# Patient Record
Sex: Male | Born: 1980 | Race: Black or African American | Hispanic: No | Smoking: Former smoker
Health system: Southern US, Community
[De-identification: ages and names within clinical notes are randomized; demographics above are authoritative.]

## PROBLEM LIST (undated history)

## (undated) DIAGNOSIS — I10 Essential (primary) hypertension: Secondary | ICD-10-CM

## (undated) DIAGNOSIS — K589 Irritable bowel syndrome without diarrhea: Secondary | ICD-10-CM

## (undated) DIAGNOSIS — F1010 Alcohol abuse, uncomplicated: Secondary | ICD-10-CM

## (undated) DIAGNOSIS — I639 Cerebral infarction, unspecified: Secondary | ICD-10-CM

## (undated) HISTORY — PX: APPENDECTOMY: SHX54

## (undated) HISTORY — PX: KNEE SURGERY: SHX244

## (undated) HISTORY — PX: TONSILLECTOMY: SUR1361

---

## 2005-03-20 ENCOUNTER — Emergency Department (HOSPITAL_COMMUNITY): Admission: EM | Admit: 2005-03-20 | Discharge: 2005-03-20 | Payer: Self-pay | Admitting: Emergency Medicine

## 2010-01-27 ENCOUNTER — Ambulatory Visit: Payer: Self-pay | Admitting: Diagnostic Radiology

## 2010-01-27 ENCOUNTER — Emergency Department (HOSPITAL_BASED_OUTPATIENT_CLINIC_OR_DEPARTMENT_OTHER): Admission: EM | Admit: 2010-01-27 | Discharge: 2010-01-28 | Payer: Self-pay | Admitting: Emergency Medicine

## 2010-08-23 ENCOUNTER — Emergency Department (INDEPENDENT_AMBULATORY_CARE_PROVIDER_SITE_OTHER)
Admission: EM | Admit: 2010-08-23 | Discharge: 2010-08-23 | Payer: Self-pay | Source: Home / Self Care | Admitting: Emergency Medicine

## 2010-08-23 DIAGNOSIS — M542 Cervicalgia: Secondary | ICD-10-CM

## 2010-08-23 DIAGNOSIS — R079 Chest pain, unspecified: Secondary | ICD-10-CM

## 2010-11-02 ENCOUNTER — Emergency Department (HOSPITAL_BASED_OUTPATIENT_CLINIC_OR_DEPARTMENT_OTHER)
Admission: EM | Admit: 2010-11-02 | Discharge: 2010-11-02 | Disposition: A | Discharge status: Home / Self Care | Payer: Self-pay | Attending: Emergency Medicine | Admitting: Emergency Medicine

## 2010-11-02 DIAGNOSIS — E860 Dehydration: Secondary | ICD-10-CM | POA: Insufficient documentation

## 2010-11-02 DIAGNOSIS — R197 Diarrhea, unspecified: Secondary | ICD-10-CM | POA: Insufficient documentation

## 2010-11-02 DIAGNOSIS — F17200 Nicotine dependence, unspecified, uncomplicated: Secondary | ICD-10-CM | POA: Insufficient documentation

## 2010-11-02 DIAGNOSIS — R51 Headache: Secondary | ICD-10-CM | POA: Insufficient documentation

## 2010-11-02 DIAGNOSIS — R112 Nausea with vomiting, unspecified: Secondary | ICD-10-CM | POA: Insufficient documentation

## 2010-11-02 LAB — BASIC METABOLIC PANEL
BUN: 9 mg/dL (ref 6–23)
CO2: 24 mEq/L (ref 19–32)
Calcium: 9.9 mg/dL (ref 8.4–10.5)
Creatinine, Ser: 0.9 mg/dL (ref 0.4–1.5)
GFR calc Af Amer: >60 mL/min (ref >60)
GFR calc non Af Amer: >60 mL/min (ref >60)
Glucose, Bld: 107 mg/dL — ABNORMAL HIGH (ref 70–99)
Sodium: 145 mEq/L (ref 135–145)

## 2011-09-29 ENCOUNTER — Emergency Department (INDEPENDENT_AMBULATORY_CARE_PROVIDER_SITE_OTHER): Payer: No Typology Code available for payment source

## 2011-09-29 ENCOUNTER — Encounter (HOSPITAL_BASED_OUTPATIENT_CLINIC_OR_DEPARTMENT_OTHER): Payer: Self-pay | Admitting: *Deleted

## 2011-09-29 ENCOUNTER — Emergency Department (HOSPITAL_BASED_OUTPATIENT_CLINIC_OR_DEPARTMENT_OTHER)
Admission: EM | Admit: 2011-09-29 | Discharge: 2011-09-30 | Disposition: A | Discharge status: Home Health | Payer: No Typology Code available for payment source | Attending: Emergency Medicine | Admitting: Emergency Medicine

## 2011-09-29 DIAGNOSIS — R079 Chest pain, unspecified: Secondary | ICD-10-CM

## 2011-09-29 DIAGNOSIS — Z79899 Other long term (current) drug therapy: Secondary | ICD-10-CM | POA: Insufficient documentation

## 2011-09-29 DIAGNOSIS — M549 Dorsalgia, unspecified: Secondary | ICD-10-CM

## 2011-09-29 DIAGNOSIS — M542 Cervicalgia: Secondary | ICD-10-CM

## 2011-09-29 DIAGNOSIS — K589 Irritable bowel syndrome without diarrhea: Secondary | ICD-10-CM | POA: Insufficient documentation

## 2011-09-29 DIAGNOSIS — S139XXA Sprain of joints and ligaments of unspecified parts of neck, initial encounter: Secondary | ICD-10-CM | POA: Insufficient documentation

## 2011-09-29 DIAGNOSIS — F17200 Nicotine dependence, unspecified, uncomplicated: Secondary | ICD-10-CM

## 2011-09-29 DIAGNOSIS — R51 Headache: Secondary | ICD-10-CM

## 2011-09-29 DIAGNOSIS — S161XXA Strain of muscle, fascia and tendon at neck level, initial encounter: Secondary | ICD-10-CM

## 2011-09-29 DIAGNOSIS — R42 Dizziness and giddiness: Secondary | ICD-10-CM

## 2011-09-29 HISTORY — DX: Irritable bowel syndrome without diarrhea: K58.9

## 2011-09-29 MED ORDER — TRAMADOL HCL 50 MG PO TABS
50.0000 mg | ORAL_TABLET | Freq: Once | ORAL | Status: AC
  Administered 2011-09-29: 50 mg via ORAL
  Filled 2011-09-29: qty 1
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
## 2011-09-30 MED ORDER — HYDROCODONE-ACETAMINOPHEN 5-325 MG PO TABS: 1.0000 | ORAL_TABLET | Freq: Four times a day (QID) | ORAL | Status: AC | PRN

## 2011-09-30 NOTE — Discharge Instructions (Signed)
Cervical Strain Care After A cervical strain is when the muscles and ligaments in your neck have been stretched. The bones are not broken. If you had any problems moving your arms or legs immediately after the injury, even if the problem has gone away, make sure to tell this to your caregiver.  HOME CARE INSTRUCTIONS   While awake, apply ice packs to the neck or areas of pain about every 1 to 2 hours, for 15 to 20 minutes at a time. Do this for 2 days. If you were given a cervical collar for support, ask your caregiver if you may remove it for bathing or applying ice.   If given a cervical collar, wear as instructed. Do not remove any collar unless instructed by a caregiver.   Only take over-the-counter or prescription medicines for pain, discomfort, or fever as directed by your caregiver.  Recheck with the hospital or clinic after a radiologist has read your X-rays. Recheck with the hospital or clinic to make sure the initial readings are correct. Do this also to determine if you need further studies. It is your responsibility to find out your X-ray results. X-rays are sometimes repeated in one week to ten days. These are often repeated to make sure that a hairline fracture was not overlooked. Ask your caregiver how you are to find out about your radiology (X-ray) results. SEEK IMMEDIATE MEDICAL CARE IF:   You have increasing pain in your neck.   You develop difficulties swallowing or breathing.   You have numbness, weakness, or movement problems in the arms or legs.   You have difficulty walking.   You develop bowel or bladder retention or incontinence.   You have problems with walking.  MAKE SURE YOU:   Understand these instructions.   Will watch your condition.   Will get help right away if you are not doing well or get worse.  Document Released: 07/12/2005 Document Revised: 03/24/2011 Document Reviewed: 02/23/2008 [employer] Patient Information 2012 [employer]. 

## 2012-10-03 ENCOUNTER — Emergency Department (HOSPITAL_BASED_OUTPATIENT_CLINIC_OR_DEPARTMENT_OTHER): Payer: Self-pay

## 2012-10-03 ENCOUNTER — Emergency Department (HOSPITAL_BASED_OUTPATIENT_CLINIC_OR_DEPARTMENT_OTHER)
Admission: EM | Admit: 2012-10-03 | Discharge: 2012-10-03 | Disposition: A | Discharge status: Home / Self Care | Payer: Self-pay | Attending: Emergency Medicine | Admitting: Emergency Medicine

## 2012-10-03 ENCOUNTER — Encounter (HOSPITAL_BASED_OUTPATIENT_CLINIC_OR_DEPARTMENT_OTHER): Payer: Self-pay | Admitting: Emergency Medicine

## 2012-10-03 DIAGNOSIS — Z7982 Long term (current) use of aspirin: Secondary | ICD-10-CM | POA: Insufficient documentation

## 2012-10-03 DIAGNOSIS — Z23 Encounter for immunization: Secondary | ICD-10-CM | POA: Insufficient documentation

## 2012-10-03 DIAGNOSIS — S0990XA Unspecified injury of head, initial encounter: Secondary | ICD-10-CM | POA: Insufficient documentation

## 2012-10-03 DIAGNOSIS — Z79899 Other long term (current) drug therapy: Secondary | ICD-10-CM | POA: Insufficient documentation

## 2012-10-03 DIAGNOSIS — M542 Cervicalgia: Secondary | ICD-10-CM

## 2012-10-03 DIAGNOSIS — I10 Essential (primary) hypertension: Secondary | ICD-10-CM | POA: Insufficient documentation

## 2012-10-03 DIAGNOSIS — IMO0002 Reserved for concepts with insufficient information to code with codable children: Secondary | ICD-10-CM | POA: Insufficient documentation

## 2012-10-03 DIAGNOSIS — Z8719 Personal history of other diseases of the digestive system: Secondary | ICD-10-CM | POA: Insufficient documentation

## 2012-10-03 DIAGNOSIS — S0993XA Unspecified injury of face, initial encounter: Secondary | ICD-10-CM | POA: Insufficient documentation

## 2012-10-03 DIAGNOSIS — S80212A Abrasion, left knee, initial encounter: Secondary | ICD-10-CM

## 2012-10-03 HISTORY — DX: Essential (primary) hypertension: I10

## 2012-10-03 MED ORDER — IBUPROFEN 800 MG PO TABS
ORAL_TABLET | ORAL | Status: AC
  Administered 2012-10-03: 800 mg
  Filled 2012-10-03: qty 1

## 2012-10-03 MED ORDER — TETANUS-DIPHTH-ACELL PERTUSSIS 5-2.5-18.5 LF-MCG/0.5 IM SUSP
0.5000 mL | Freq: Once | INTRAMUSCULAR | Status: AC
  Administered 2012-10-03: 0.5 mL via INTRAMUSCULAR
  Filled 2012-10-03: qty 0.5

## 2012-10-03 MED ORDER — OXYCODONE-ACETAMINOPHEN 5-325 MG PO TABS: 2.0000 | ORAL_TABLET | ORAL | Status: DC | PRN

## 2012-10-03 MED ORDER — IBUPROFEN 800 MG PO TABS: 800.0000 mg | ORAL_TABLET | Freq: Once | ORAL | Status: DC

## 2012-10-03 NOTE — ED Notes (Signed)
Pt refuses to sign out at d/c. Pt continues to curse and complain that he didn't get anything strong enough for pain. NP made aware and new orders rec'd for meds and given per order. Pt escorted out in W/C with family in [place].
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
## 2012-10-03 NOTE — ED Notes (Signed)
Pt alert and oriented, resps even and unlabored. Pt remains in C-collar, SL unremarkable.

## 2012-10-03 NOTE — ED Notes (Signed)
C- collar placed on Pt per Pt stating his having neck pain.

## 2012-10-03 NOTE — ED Notes (Signed)
Patient is intermittently falling asleep while talking to the this nurse.  Pt is arousable and is able to follow all commands, neuro assessment normal.  Pupils pinpoint and reactive,  Pt at times has slurred speech, denies using any alcohol or drugs pta.  Update given to V. Pickering, NPC.

## 2012-10-03 NOTE — ED Notes (Signed)
C-collar removed per Rubin Payor, NP. Pt tolerated well. Pt given PO fluids per pt request.

## 2012-10-04 NOTE — ED Provider Notes (Signed)
Medical screening examination/treatment/procedure(s) were performed by non-physician practitioner and as supervising physician I was immediately available for consultation/collaboration.   Charles B. Sheldon, MD 10/04/12 1139 

## 2013-01-02 ENCOUNTER — Encounter (HOSPITAL_BASED_OUTPATIENT_CLINIC_OR_DEPARTMENT_OTHER): Payer: Self-pay | Admitting: *Deleted

## 2013-01-02 ENCOUNTER — Emergency Department (HOSPITAL_BASED_OUTPATIENT_CLINIC_OR_DEPARTMENT_OTHER)
Admission: EM | Admit: 2013-01-02 | Discharge: 2013-01-02 | Disposition: A | Discharge status: Home / Self Care | Payer: Self-pay | Attending: Emergency Medicine | Admitting: Emergency Medicine

## 2013-01-02 DIAGNOSIS — Z79899 Other long term (current) drug therapy: Secondary | ICD-10-CM | POA: Insufficient documentation

## 2013-01-02 DIAGNOSIS — M7989 Other specified soft tissue disorders: Secondary | ICD-10-CM | POA: Insufficient documentation

## 2013-01-02 DIAGNOSIS — I10 Essential (primary) hypertension: Secondary | ICD-10-CM | POA: Insufficient documentation

## 2013-01-02 DIAGNOSIS — Z87891 Personal history of nicotine dependence: Secondary | ICD-10-CM | POA: Insufficient documentation

## 2013-01-02 DIAGNOSIS — Z8719 Personal history of other diseases of the digestive system: Secondary | ICD-10-CM | POA: Insufficient documentation

## 2013-01-02 DIAGNOSIS — L0390 Cellulitis, unspecified: Secondary | ICD-10-CM

## 2013-01-02 DIAGNOSIS — L02419 Cutaneous abscess of limb, unspecified: Secondary | ICD-10-CM | POA: Insufficient documentation

## 2013-01-02 MED ORDER — CLINDAMYCIN HCL 150 MG PO CAPS
300.0000 mg | ORAL_CAPSULE | Freq: Once | ORAL | Status: AC
  Administered 2013-01-02: 300 mg via ORAL
  Filled 2013-01-02: qty 2

## 2013-01-02 MED ORDER — CLINDAMYCIN HCL 150 MG PO CAPS: 300.0000 mg | ORAL_CAPSULE | Freq: Four times a day (QID) | ORAL | Status: AC

## 2013-01-02 MED ORDER — OXYCODONE-ACETAMINOPHEN 5-325 MG PO TABS: 1.0000 | ORAL_TABLET | Freq: Four times a day (QID) | ORAL | Status: DC | PRN

## 2013-01-02 MED ORDER — OXYCODONE-ACETAMINOPHEN 5-325 MG PO TABS
2.0000 | ORAL_TABLET | Freq: Once | ORAL | Status: AC
  Administered 2013-01-02: 2 via ORAL
  Filled 2013-01-02 (×2): qty 2

## 2013-01-02 NOTE — ED Provider Notes (Signed)
History     CSN: [id number]  Arrival date & time 01/02/13  2204   First MD Initiated Contact with Patient 01/02/13 2339      Chief Complaint  Patient presents with  . Abscess    (Consider location/radiation/quality/duration/timing/severity/associated sxs/prior treatment) Patient is a 32 y.o. male presenting with abscess.  Abscess  Pt with no significant PMH reports pain and swelling to R leg for the last 2 days, he thought he had an ingrown hair so he was digging with tweezers. He denies any fever or drainage but reports increasing pain in R anterior thigh, worse with movement.  Past Medical History  Diagnosis Date  . IBS (irritable bowel syndrome)   . Hypertension     Past Surgical History  Procedure Laterality Date  . Knee surgery    . Tonsillectomy    . Appendectomy      No family history on file.  History  Substance Use Topics  . Smoking status: Former Games developer  . Smokeless tobacco: Not on file  . Alcohol Use: No      Review of Systems All other systems reviewed and are negative except as noted in HPI.   Allergies  Hydrocodone  Home Medications   Current Outpatient Rx  Name  Route  Sig  Dispense  Refill  . diphenoxylate-atropine (LOMOTIL) 2.5-0.025 MG per tablet   Oral   Take 1 tablet by mouth 4 (four) times daily as needed for diarrhea or loose stools.         . pantoprazole (PROTONIX) 40 MG tablet   Oral   Take 40 mg by mouth daily.         Marland Kitchen aspirin 325 MG tablet   Oral   Take 325 mg by mouth daily. Patient took this medication for pain.         Marland Kitchen oxyCODONE-acetaminophen (PERCOCET/ROXICET) 5-325 MG per tablet   Oral   Take 2 tablets by mouth every 4 (four) hours as needed for pain.   6 tablet   0   . Probiotic Product (PROBIOTIC PO)   Oral   Take 1 tablet by mouth daily.           BP 139/81  Pulse 102  Temp(Src) 99.7 F (37.6 C) (Oral)  Resp 20  Wt 300 lb (136.079 kg)  BMI 44.28 kg/m2  SpO2 98%  Physical Exam  Nursing  note and vitals reviewed. Constitutional: He is oriented to person, place, and time. He appears well-developed and well-nourished.  HENT:  Head: Normocephalic and atraumatic.  Eyes: EOM are normal. Pupils are equal, round, and reactive to light.  Neck: Normal range of motion. Neck supple.  Cardiovascular: Normal rate, normal heart sounds and intact distal pulses.   Pulmonary/Chest: Effort normal and breath sounds normal.  Abdominal: Bowel sounds are normal. He exhibits no distension. There is no tenderness.  Musculoskeletal: Normal range of motion. He exhibits tenderness. He exhibits no edema.  Neurological: He is alert and oriented to person, place, and time. He has normal strength. No cranial nerve deficit or sensory deficit.  Skin: Skin is warm and dry.  Small area of folliculitis on R anterior thigh with significant surrounding erythema and induration, but no focal fluctuance.   Psychiatric: He has a normal mood and affect.    ED Course  Procedures (including critical care time)  Labs Reviewed - No data to display No results found.   No diagnosis found.    MDM  Erythema margins marked, warm compressed, pain  medications and Clindamycin for cellulitis of leg.         Krystan Northrop B. Bernette Mayers, MD 01/02/13 [phone]

## 2013-01-02 NOTE — ED Notes (Signed)
Abscess to his right leg. Drainage, hot, red, swollen and painful.

## 2013-07-26 DIAGNOSIS — F1010 Alcohol abuse, uncomplicated: Secondary | ICD-10-CM

## 2013-07-26 HISTORY — DX: Alcohol abuse, uncomplicated: F10.10

## 2014-05-30 ENCOUNTER — Encounter (HOSPITAL_BASED_OUTPATIENT_CLINIC_OR_DEPARTMENT_OTHER): Payer: Self-pay | Admitting: *Deleted

## 2014-05-30 ENCOUNTER — Emergency Department (HOSPITAL_BASED_OUTPATIENT_CLINIC_OR_DEPARTMENT_OTHER)
Admission: EM | Admit: 2014-05-30 | Discharge: 2014-05-30 | Disposition: A | Discharge status: Home / Self Care | Payer: Self-pay | Attending: Emergency Medicine | Admitting: Emergency Medicine

## 2014-05-30 DIAGNOSIS — G4459 Other complicated headache syndrome: Secondary | ICD-10-CM | POA: Insufficient documentation

## 2014-05-30 DIAGNOSIS — Z87891 Personal history of nicotine dependence: Secondary | ICD-10-CM | POA: Insufficient documentation

## 2014-05-30 DIAGNOSIS — K589 Irritable bowel syndrome without diarrhea: Secondary | ICD-10-CM | POA: Insufficient documentation

## 2014-05-30 DIAGNOSIS — Z79899 Other long term (current) drug therapy: Secondary | ICD-10-CM | POA: Insufficient documentation

## 2014-05-30 DIAGNOSIS — I10 Essential (primary) hypertension: Secondary | ICD-10-CM | POA: Insufficient documentation

## 2014-05-30 DIAGNOSIS — Z7982 Long term (current) use of aspirin: Secondary | ICD-10-CM | POA: Insufficient documentation

## 2014-05-30 LAB — COMPREHENSIVE METABOLIC PANEL
ALK PHOS: 40 U/L (ref 39–117)
ALT: 87 U/L — ABNORMAL HIGH (ref 0–53)
ANION GAP: 14 (ref 5–15)
AST: 39 U/L — ABNORMAL HIGH (ref 0–37)
Albumin: 3.7 g/dL (ref 3.5–5.2)
BUN: 11 mg/dL (ref 6–23)
CALCIUM: 9.6 mg/dL (ref 8.4–10.5)
CHLORIDE: 101 meq/L (ref 96–112)
CO2: 27 meq/L (ref 19–32)
CREATININE: 0.9 mg/dL (ref 0.50–1.35)
GFR calc Af Amer: >90 mL/min (ref >90)
Glucose, Bld: 153 mg/dL — ABNORMAL HIGH (ref 70–99)
POTASSIUM: 3.5 meq/L — AB (ref 3.7–5.3)
Sodium: 142 mEq/L (ref 137–147)
TOTAL PROTEIN: 7.1 g/dL (ref 6.0–8.3)
Total Bilirubin: <0.2 mg/dL — ABNORMAL LOW (ref 0.3–1.2)

## 2014-05-30 LAB — CBC WITH DIFFERENTIAL/PLATELET
BASOS ABS: 0 10*3/uL (ref 0.0–0.1)
Basophils Relative: 0 % (ref 0–1)
EOS PCT: 2 % (ref 0–5)
Eosinophils Absolute: 0.2 10*3/uL (ref 0.0–0.7)
HEMATOCRIT: 40.6 % (ref 39.0–52.0)
Hemoglobin: 13.7 g/dL (ref 13.0–17.0)
LYMPHS ABS: 3.5 10*3/uL (ref 0.7–4.0)
Lymphocytes Relative: 33 % (ref 12–46)
MCH: 26.9 pg (ref 26.0–34.0)
MCHC: 33.7 g/dL (ref 30.0–36.0)
MCV: 79.8 fL (ref 78.0–100.0)
Monocytes Absolute: 0.7 10*3/uL (ref 0.1–1.0)
Monocytes Relative: 7 % (ref 3–12)
NEUTROS ABS: 6.2 10*3/uL (ref 1.7–7.7)
NEUTROS PCT: 58 % (ref 43–77)
PLATELETS: 191 10*3/uL (ref 150–400)
RBC: 5.09 MIL/uL (ref 4.22–5.81)
RDW: 14.8 % (ref 11.5–15.5)
WBC: 10.6 10*3/uL — AB (ref 4.0–10.5)

## 2014-05-30 MED ORDER — CLONIDINE HCL 0.2 MG PO TABS: 0.2000 mg | ORAL_TABLET | Freq: Two times a day (BID) | ORAL | Status: DC

## 2014-05-30 MED ORDER — CLONIDINE HCL 0.1 MG PO TABS
0.2000 mg | ORAL_TABLET | Freq: Once | ORAL | Status: AC
  Administered 2014-05-30: 0.2 mg via ORAL
  Filled 2014-05-30: qty 2

## 2014-05-30 MED ORDER — DIPHENHYDRAMINE HCL 50 MG/ML IJ SOLN
25.0000 mg | Freq: Once | INTRAMUSCULAR | Status: AC
  Administered 2014-05-30: 25 mg via INTRAVENOUS
  Filled 2014-05-30: qty 1

## 2014-05-30 MED ORDER — METOCLOPRAMIDE HCL 5 MG/ML IJ SOLN
10.0000 mg | Freq: Once | INTRAMUSCULAR | Status: AC
  Administered 2014-05-30: 10 mg via INTRAVENOUS
  Filled 2014-05-30: qty 2

## 2014-05-30 NOTE — ED Notes (Signed)
Hypertension. [facility]

## 2014-05-30 NOTE — ED Provider Notes (Signed)
CSN: [id number]     Arrival date & time 05/30/14  1756 History  This chart was scribed for Evan Canalavid H Yao, MD by Evon Slackerrance Branch, ED Scribe. This patient was seen in room MHT13/MHT13 and the patient's care was started at 7:46 PM.    Chief Complaint  Patient presents with  . Hypertension   The history is provided by the patient. No language interpreter was used.   HPI Comments: Evan Hahn is a 33 y.o. male who presents to the Emergency Department complaining of unstable HTN since starting new medication Norvasc 2 weeks ago. He states he has been having an associated headache. States BP has normally been around 130-140/90. He states over the past few days his diastolic pressure has been averaging around 110. He state he has also been tachycardic. He states that [facility]. He states that he hasn't had any alcohol since august 2015. [facility] blood pressure was around 163/111. He states that he was on clonidine  previously that provided him relief for his Blood pressure. Denies CP, abdominal pain, fever or vomiting.    Past Medical History  Diagnosis Date  . IBS (irritable bowel syndrome)   . Hypertension    Past Surgical History  Procedure Laterality Date  . Knee surgery    . Tonsillectomy    . Appendectomy     No family history on file. History  Substance Use Topics  . Smoking status: Former Games developermoker  . Smokeless tobacco: Not on file  . Alcohol Use: No    Review of Systems  Constitutional: Negative for fever.  Cardiovascular: Negative for chest pain.  Gastrointestinal: Negative for vomiting and abdominal pain.  Neurological: Positive for headaches.  All other systems reviewed and are negative.     Allergies  Hydrocodone  Home Medications   Prior to Admission medications   Medication Sig Start Date End Date Taking? Authorizing Provider  AmLODIPine Besylate (NORVASC PO) Take by mouth.   Yes Historical  Provider, MD  Citalopram Hydrobromide (CELEXA PO) Take by mouth.   Yes Historical Provider, MD  TRAZODONE HCL PO Take by mouth.   Yes Historical Provider, MD  aspirin 325 MG tablet Take 325 mg by mouth daily. Patient took this medication for pain.    Historical Provider, MD  clindamycin (CLEOCIN) 150 MG capsule Take 2 capsules (300 mg total) by mouth 4 (four) times daily. 01/02/13   Charles B. Bernette MayersSheldon, MD  diphenoxylate-atropine (LOMOTIL) 2.5-0.025 MG per tablet Take 1 tablet by mouth 4 (four) times daily as needed for diarrhea or loose stools.    Historical Provider, MD  oxyCODONE-acetaminophen (PERCOCET/ROXICET) 5-325 MG per tablet Take 2 tablets by mouth every 4 (four) hours as needed for pain. 10/03/12   Teressa LowerVrinda Pickering, NP  oxyCODONE-acetaminophen (PERCOCET/ROXICET) 5-325 MG per tablet Take 1-2 tablets by mouth every 6 (six) hours as needed for pain. 01/02/13   Charles B. Bernette MayersSheldon, MD  pantoprazole (PROTONIX) 40 MG tablet Take 40 mg by mouth daily.    Historical Provider, MD  Probiotic Product (PROBIOTIC PO) Take 1 tablet by mouth daily.    Historical Provider, MD   Triage Vitals: BP 148/97 mmHg  Pulse 98  Temp(Src) 98.3 F (36.8 C) (Oral)  Resp 20  Ht 5\' 9"  (1.753 m)  Wt 320 lb (145.151 kg)  BMI 47.23 kg/m2  SpO2 99%  Physical Exam  Constitutional: He is oriented to person, place, and time. He appears well-developed  and well-nourished. No distress.  HENT:  Head: Normocephalic and atraumatic.  Eyes: Conjunctivae and EOM are normal.  Neck: Neck supple. No tracheal deviation present.  Cardiovascular: Normal rate, regular rhythm and normal heart sounds.   Pulmonary/Chest: Effort normal and breath sounds normal. No respiratory distress.  Abdominal: Soft. There is no tenderness.  Musculoskeletal: Normal range of motion.  Neurological: He is alert and oriented to person, place, and time.  No pronator drift, 2-12 cranial nerves intact, strength good throughout.  Skin: Skin is warm and  dry.  Psychiatric: He has a normal mood and affect. His behavior is normal.  Nursing note and vitals reviewed.   ED Course  Procedures (including critical care time) DIAGNOSTIC STUDIES: Oxygen Saturation is 99% on RA, normal by my interpretation.    COORDINATION OF CARE:  8:05 PM-Discussed treatment plan which includes Clonidine with pt at bedside and pt agreed to plan.   Labs Review Labs Reviewed  CBC WITH DIFFERENTIAL - Abnormal; Notable for the following:    WBC 10.6 (*)    All other components within normal limits  COMPREHENSIVE METABOLIC PANEL - Abnormal; Notable for the following:    Potassium 3.5 (*)    Glucose, Bld 153 (*)    AST 39 (*)    ALT 87 (*)    Total Bilirubin <0.2 (*)    All other components within normal limits    Imaging Review No results found.   EKG Interpretation None      MDM   Final diagnoses:  None   Evan Hahn is a 33 y.o. male here with headache, hypertension. Was on clonidine previously but [facility]. Given clonidine. BP slightly improved. Headache improved with migraine cocktail. BP improved to 117/53 from 148/97. Will restart clonidine. Neuro exam unremarkable, no need for imaging and I doubt SAH.     I personally performed the services described in this documentation, which was scribed in my presence. The recorded information has been reviewed and is accurate.      Evan Canalavid H Yao, MD 05/30/14 [phone]

## 2014-05-30 NOTE — Discharge Instructions (Signed)
Take clonidine 0.2 mg twice a day in addition to your medicines.  Follow up with your doctor. Recheck BP in a week.   Return to ER if you have severe headaches, vomiting.

## 2015-03-09 ENCOUNTER — Emergency Department (HOSPITAL_BASED_OUTPATIENT_CLINIC_OR_DEPARTMENT_OTHER)
Admission: EM | Admit: 2015-03-09 | Discharge: 2015-03-09 | Disposition: A | Discharge status: Home / Self Care | Payer: Self-pay | Attending: Emergency Medicine | Admitting: Emergency Medicine

## 2015-03-09 ENCOUNTER — Emergency Department (HOSPITAL_BASED_OUTPATIENT_CLINIC_OR_DEPARTMENT_OTHER): Payer: Self-pay

## 2015-03-09 ENCOUNTER — Encounter (HOSPITAL_BASED_OUTPATIENT_CLINIC_OR_DEPARTMENT_OTHER): Payer: Self-pay | Admitting: *Deleted

## 2015-03-09 DIAGNOSIS — Z87891 Personal history of nicotine dependence: Secondary | ICD-10-CM | POA: Insufficient documentation

## 2015-03-09 DIAGNOSIS — Z7982 Long term (current) use of aspirin: Secondary | ICD-10-CM | POA: Insufficient documentation

## 2015-03-09 DIAGNOSIS — Z79899 Other long term (current) drug therapy: Secondary | ICD-10-CM | POA: Insufficient documentation

## 2015-03-09 DIAGNOSIS — Y92410 Unspecified street and highway as the place of occurrence of the external cause: Secondary | ICD-10-CM | POA: Insufficient documentation

## 2015-03-09 DIAGNOSIS — S299XXA Unspecified injury of thorax, initial encounter: Secondary | ICD-10-CM | POA: Insufficient documentation

## 2015-03-09 DIAGNOSIS — Z8673 Personal history of transient ischemic attack (TIA), and cerebral infarction without residual deficits: Secondary | ICD-10-CM | POA: Insufficient documentation

## 2015-03-09 DIAGNOSIS — Z8719 Personal history of other diseases of the digestive system: Secondary | ICD-10-CM | POA: Insufficient documentation

## 2015-03-09 DIAGNOSIS — S4992XA Unspecified injury of left shoulder and upper arm, initial encounter: Secondary | ICD-10-CM | POA: Insufficient documentation

## 2015-03-09 DIAGNOSIS — Y9389 Activity, other specified: Secondary | ICD-10-CM | POA: Insufficient documentation

## 2015-03-09 DIAGNOSIS — S99912A Unspecified injury of left ankle, initial encounter: Secondary | ICD-10-CM | POA: Insufficient documentation

## 2015-03-09 DIAGNOSIS — Y998 Other external cause status: Secondary | ICD-10-CM | POA: Insufficient documentation

## 2015-03-09 DIAGNOSIS — I10 Essential (primary) hypertension: Secondary | ICD-10-CM | POA: Insufficient documentation

## 2015-03-09 HISTORY — DX: Cerebral infarction, unspecified: I63.9

## 2015-03-09 HISTORY — DX: Alcohol abuse, uncomplicated: F10.10

## 2015-03-09 MED ORDER — OXYCODONE-ACETAMINOPHEN 5-325 MG PO TABS: 2.0000 | ORAL_TABLET | ORAL | Status: AC | PRN

## 2015-03-09 MED ORDER — CLONIDINE HCL 0.1 MG PO TABS
0.1000 mg | ORAL_TABLET | Freq: Two times a day (BID) | ORAL | Status: AC
Start: 2015-03-09 — End: ?

## 2015-03-09 NOTE — ED Notes (Signed)
MD at bedside. 

## 2015-03-09 NOTE — ED Notes (Signed)
Patient transported to X-ray 
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
## 2015-03-09 NOTE — Discharge Instructions (Signed)

## 2015-09-24 DEATH — deceased
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
